# Patient Record
Sex: Female | Born: 1995 | Race: White | Hispanic: No | Marital: Single | Smoking: Never smoker
Health system: Southern US, Community
[De-identification: ages and names within clinical notes are randomized; demographics above are authoritative.]

## PROBLEM LIST (undated history)

## (undated) DIAGNOSIS — J45909 Unspecified asthma, uncomplicated: Secondary | ICD-10-CM

---

## 2019-11-23 ENCOUNTER — Other Ambulatory Visit: Payer: Self-pay

## 2019-11-23 ENCOUNTER — Observation Stay (HOSPITAL_COMMUNITY)
Admission: EM | Admit: 2019-11-23 | Discharge: 2019-11-24 | Disposition: A | Discharge status: Home / Self Care | Payer: BC Managed Care – PPO | Attending: Internal Medicine | Admitting: Internal Medicine

## 2019-11-23 ENCOUNTER — Encounter (HOSPITAL_COMMUNITY): Payer: Self-pay | Admitting: Emergency Medicine

## 2019-11-23 ENCOUNTER — Emergency Department (HOSPITAL_COMMUNITY): Payer: BC Managed Care – PPO

## 2019-11-23 DIAGNOSIS — J9601 Acute respiratory failure with hypoxia: Secondary | ICD-10-CM

## 2019-11-23 DIAGNOSIS — R000 Tachycardia, unspecified: Secondary | ICD-10-CM | POA: Diagnosis not present

## 2019-11-23 DIAGNOSIS — J45901 Unspecified asthma with (acute) exacerbation: Secondary | ICD-10-CM | POA: Diagnosis present

## 2019-11-23 DIAGNOSIS — Z20822 Contact with and (suspected) exposure to covid-19: Secondary | ICD-10-CM | POA: Diagnosis not present

## 2019-11-23 DIAGNOSIS — R0602 Shortness of breath: Secondary | ICD-10-CM | POA: Diagnosis present

## 2019-11-23 DIAGNOSIS — J4521 Mild intermittent asthma with (acute) exacerbation: Secondary | ICD-10-CM | POA: Diagnosis not present

## 2019-11-23 DIAGNOSIS — Z793 Long term (current) use of hormonal contraceptives: Secondary | ICD-10-CM | POA: Insufficient documentation

## 2019-11-23 HISTORY — DX: Unspecified asthma, uncomplicated: J45.909

## 2019-11-23 LAB — CBC
HCT: 45.4 % (ref 36.0–46.0)
Hemoglobin: 14.7 g/dL (ref 12.0–15.0)
MCH: 31.1 pg (ref 26.0–34.0)
MCHC: 32.4 g/dL (ref 30.0–36.0)
MCV: 96.2 fL (ref 80.0–100.0)
Platelets: 215 10*3/uL (ref 150–400)
RBC: 4.72 MIL/uL (ref 3.87–5.11)
RDW: 13.6 % (ref 11.5–15.5)
WBC: 11.7 10*3/uL — ABNORMAL HIGH (ref 4.0–10.5)
nRBC: 0 % (ref 0.0–0.2)

## 2019-11-23 LAB — BASIC METABOLIC PANEL
Anion gap: 11 (ref 5–15)
BUN: 8 mg/dL (ref 6–20)
CO2: 24 mmol/L (ref 22–32)
Calcium: 9.4 mg/dL (ref 8.9–10.3)
Chloride: 105 mmol/L (ref 98–111)
Creatinine, Ser: 0.97 mg/dL (ref 0.44–1.00)
GFR calc Af Amer: >60 mL/min (ref >60)
GFR calc non Af Amer: >60 mL/min (ref >60)
Glucose, Bld: 101 mg/dL — ABNORMAL HIGH (ref 70–99)
Potassium: 4.2 mmol/L (ref 3.5–5.1)
Sodium: 140 mmol/L (ref 135–145)

## 2019-11-23 LAB — I-STAT BETA HCG BLOOD, ED (MC, WL, AP ONLY): I-stat hCG, quantitative: <5 m[IU]/mL (ref <5)

## 2019-11-23 LAB — TROPONIN I (HIGH SENSITIVITY)
Troponin I (High Sensitivity): <2 ng/L (ref <18)
Troponin I (High Sensitivity): 2 ng/L (ref <18)

## 2019-11-23 MED ORDER — SODIUM CHLORIDE 0.9 % IV BOLUS
1000.0000 mL | Freq: Once | INTRAVENOUS | Status: AC
  Administered 2019-11-23: 1000 mL via INTRAVENOUS

## 2019-11-23 MED ORDER — AEROCHAMBER PLUS FLO-VU LARGE MISC
1.0000 | Freq: Once | Status: AC
  Administered 2019-11-23: 1

## 2019-11-23 MED ORDER — IPRATROPIUM BROMIDE HFA 17 MCG/ACT IN AERS
2.0000 | INHALATION_SPRAY | Freq: Once | RESPIRATORY_TRACT | Status: AC
  Administered 2019-11-23: 2 via RESPIRATORY_TRACT
  Filled 2019-11-23: qty 12.9

## 2019-11-23 MED ORDER — SODIUM CHLORIDE 0.9% FLUSH
3.0000 mL | Freq: Once | INTRAVENOUS | Status: AC
  Administered 2019-11-24: 3 mL via INTRAVENOUS

## 2019-11-23 MED ORDER — METHYLPREDNISOLONE SODIUM SUCC 125 MG IJ SOLR
125.0000 mg | Freq: Once | INTRAMUSCULAR | Status: AC
  Administered 2019-11-24: 125 mg via INTRAVENOUS
  Filled 2019-11-23: qty 2

## 2019-11-23 MED ORDER — ALBUTEROL SULFATE HFA 108 (90 BASE) MCG/ACT IN AERS
8.0000 | INHALATION_SPRAY | Freq: Once | RESPIRATORY_TRACT | Status: AC
  Administered 2019-11-23: 8 via RESPIRATORY_TRACT
  Filled 2019-11-23: qty 6.7

## 2019-11-23 NOTE — ED Provider Notes (Signed)
[facility] EMERGENCY DEPARTMENT Provider Note   CSN: [id number] Arrival date & time: 11/23/19  1721     History Chief Complaint  Patient presents with   Shortness of Breath   Cough    Melinda Mclean is a 24 y.o. female with a history of asthma who presents to the emergency department from urgent care with a chief complaint of shortness of breath.  The patient reports that around 4 days ago she developed sinus pain and pressure, rhinorrhea, nasal congestion, headache approximately 5 days ago.  She initially thought she had a sinus infection then 2 days that she began to feel short of breath and developed a productive cough with green phlegm, bilateral upper back pain, chest tightness, and wheezing.  Her symptoms progressively worsened so she went to [facility] earlier today.  She had a rapid COVID-19 test that was negative, but they advised her to come to the ER for further evaluation due to oxygen saturation of 78% on room air and tachycardia.   On arrival to the ER, she was tachycardic in the 150s.  She denies dizziness, lightheadedness, fever, chills, abdominal pain, leg swelling, syncope, nausea, vomiting, or diarrhea.  Reports that she has an albuterol inhaler, but it is at her home in [place].  No recent long travel.  No history of VTE.  Melinda Mclean was evaluated in Emergency Department on 11/24/2019 for the symptoms described in the history of present illness. She was evaluated in the context of the global COVID-19 pandemic, which necessitated consideration that the patient might be at risk for infection with the SARS-CoV-2 virus that causes COVID-19. Institutional protocols and algorithms that pertain to the evaluation of patients at risk for COVID-19 are in a state of rapid change based on information released by regulatory bodies including the CDC and federal and state organizations. These policies and algorithms were followed during  the patient's care in the ED.   The history is provided by the patient. No language interpreter was used.       Past Medical History:  Diagnosis Date   Asthma     Patient Active Problem List   Diagnosis Date Noted   Acute asthma exacerbation 11/24/2019    History reviewed. No pertinent surgical history.   OB History   No obstetric history on file.     History reviewed. No pertinent family history.  Social History   Tobacco Use   Smoking status: Never Smoker   Smokeless tobacco: Never Used  Substance Use Topics   Alcohol use: Yes   Drug use: Yes    Types: Cocaine, Marijuana    Home Medications Prior to Admission medications   Medication Sig Start Date End Date Taking? Authorizing Provider  JUNEL FE 1/20 1-20 MG-MCG tablet Take 1 tablet by mouth at bedtime. 10/25/19  Yes [provider]    Allergies    Patient has no known allergies.  Review of Systems   Review of Systems  Constitutional: Negative for activity change, chills, diaphoresis and fever.  Respiratory: Positive for cough, chest tightness, shortness of breath and wheezing.   Cardiovascular: Negative for chest pain.  Gastrointestinal: Negative for abdominal pain, diarrhea, nausea and vomiting.  Genitourinary: Negative for dysuria.  Musculoskeletal: Positive for back pain. Negative for myalgias, neck pain and neck stiffness.  Skin: Negative for rash.  Allergic/Immunologic: Negative for immunocompromised state.  Neurological: Negative for seizures, syncope, weakness, numbness and headaches.  Psychiatric/Behavioral: Negative for confusion.  Physical Exam Updated Vital Signs BP (!) 110/53    Pulse (!) 124    Temp 99.5 F (37.5 C)    Resp 19    Ht 5\' 3"  (1.6 m)    Wt 61.2 kg    SpO2 94%    BMI 23.91 kg/m   Physical Exam Vitals and nursing note reviewed.  Constitutional:      General: She is not in acute distress.    Comments: Well-appearing.  Nontoxic.  No acute distress.  HENT:      Head: Normocephalic.  Eyes:     Conjunctiva/sclera: Conjunctivae normal.  Neck:     Thyroid: No thyroid mass, thyromegaly or thyroid tenderness.  Cardiovascular:     Rate and Rhythm: Normal rate and regular rhythm.     Heart sounds: No murmur. No friction rub. No gallop.   Pulmonary:     Effort: Pulmonary effort is normal. Tachypnea present. No respiratory distress.     Breath sounds: No stridor. Wheezing present. No rhonchi or rales.     Comments: Inspiratory and expiratory wheezes in all fields.  Poor air movement.  No accessory muscle use or retractions.  No respiratory distress. Chest:     Chest wall: No tenderness.  Abdominal:     General: There is no distension.     Palpations: Abdomen is soft.  Musculoskeletal:     Cervical back: Neck supple.     Right lower leg: No edema.     Left lower leg: No edema.  Skin:    General: Skin is warm.     Findings: No rash.  Neurological:     Mental Status: She is alert.  Psychiatric:        Behavior: Behavior normal.     ED Results / Procedures / Treatments   Labs (all labs ordered are listed, but only abnormal results are displayed) Labs Reviewed  BASIC METABOLIC PANEL - Abnormal; Notable for the following components:      Result Value   Glucose, Bld 101 (*)    All other components within normal limits  CBC - Abnormal; Notable for the following components:   WBC 11.7 (*)    All other components within normal limits  RESPIRATORY PANEL BY RT PCR (FLU A&B, COVID)  D-DIMER, QUANTITATIVE (NOT AT [facility])  TSH  T4, FREE  T3, FREE  PROCALCITONIN  BASIC METABOLIC PANEL  CBC WITH DIFFERENTIAL/PLATELET  RAPID URINE DRUG SCREEN, HOSP PERFORMED  I-STAT BETA HCG BLOOD, ED ([facility], [facility], [facility] ONLY)  TROPONIN I (HIGH SENSITIVITY)  TROPONIN I (HIGH SENSITIVITY)    EKG EKG Interpretation  Date/Time:  Tuesday November 23 2019 18:02:51 EDT Ventricular Rate:  138 PR Interval:    QRS Duration: 74 QT Interval:  354 QTC Calculation: 536 R  Axis:   74 Text Interpretation: Sinus tachycardia Possible Anterior infarct , age undetermined ST & T wave abnormality, consider inferior ischemia Abnormal ECG No old tracing to compare Confirmed by 10-23-1979 (Eber Hong) on 11/23/2019 10:54:52 PM   Radiology DG Chest 2 View  Result Date: 11/23/2019 CLINICAL DATA:  Shortness of breath EXAM: CHEST - 2 VIEW COMPARISON:  None. FINDINGS: Heart and mediastinal contours are within normal limits. No focal opacities or effusions. No acute bony abnormality. IMPRESSION: Negative. Electronically Signed   By: 11/25/2019 M.D.   On: 11/23/2019 19:00    Procedures .Critical Care Performed by: 11/25/2019, PA-C Authorized by: Barkley Boards, PA-C   Critical care provider statement:  Critical care time (minutes):  55   Critical care time was exclusive of:  Separately billable procedures and treating other patients and teaching time   Critical care was necessary to treat or prevent imminent or life-threatening deterioration of the following conditions:  Respiratory failure   Critical care was time spent personally by me on the following activities:  Ordering and performing treatments and interventions, ordering and review of laboratory studies, ordering and review of radiographic studies, pulse oximetry, re-evaluation of patient's condition, obtaining history from patient or surrogate, examination of patient, evaluation of patient's response to treatment, development of treatment plan with patient or surrogate and review of old charts   (including critical care time)  Medications Ordered in ED Medications  magnesium sulfate IVPB 2 g 50 mL (has no administration in time range)  albuterol (PROVENTIL) (2.5 MG/3ML) 0.083% nebulizer solution 2.5 mg (has no administration in time range)  methylPREDNISolone sodium succinate (SOLU-MEDROL) 40 mg/mL injection 40 mg (has no administration in time range)  guaiFENesin (ROBITUSSIN) 100 MG/5ML solution 100 mg (has  no administration in time range)  sodium chloride flush (NS) 0.9 % injection 3 mL (3 mLs Intravenous Given 11/24/19 0042)  AeroChamber Plus Flo-Vu Large MISC 1 each (1 each Other Given 11/23/19 2338)  albuterol (VENTOLIN HFA) 108 (90 Base) MCG/ACT inhaler 8 puff (8 puffs Inhalation Given 11/23/19 2338)  ipratropium (ATROVENT HFA) inhaler 2 puff (2 puffs Inhalation Given 11/23/19 2338)  sodium chloride 0.9 % bolus 1,000 mL (0 mLs Intravenous Stopped 11/24/19 0056)  methylPREDNISolone sodium succinate (SOLU-MEDROL) 125 mg/2 mL injection 125 mg (125 mg Intravenous Given 11/24/19 0023)  albuterol (PROVENTIL) (2.5 MG/3ML) 0.083% nebulizer solution 5 mg (5 mg Nebulization Given 11/24/19 0215)    ED Course  I have reviewed the triage vital signs and the nursing notes.  Pertinent labs & imaging results that were available during my care of the patient were reviewed by me and considered in my medical decision making (see chart for details).  Clinical Course as of Nov 23 501  Wed Nov 24, 2019  0118 Patient recheck. She continues to have inspiratory and expiratory wheezes in all fields, but air movement is slightly improved. Heart rate continues to remain in the 130s to 140s on the monitor. She reports some improvement in her work of breathing.   [MM]    Clinical Course User Index [MM] Deziyah Arvin, Coral Else, PA-C   MDM Rules/Calculators/A&P                      24 year old female with a history of asthma presenting with nasal congestion, sinus pain and pressure, and rhinorrhea 4 days ago that has progressed to shortness of breath, wheezing, productive cough, chest tightness, and upper back pain over the last 2 days.  She presented from urgent care where she was found to have an oxygen saturation of 78% on room air.  She was tachycardic in the 150s on arrival to the ER.  She was initially well-appearing, but has gradually declined since arrival in the ER.  Initially, oxygen saturations were in the 90, but she  had a documented saturation of 77% and after receiving an albuterol and ipratropium inhaler, albuterol nebulizer, and Solu-Medrol she was satting at 86% on room air at rest.  Magnesium sulfate has been ordered and is currently being administered.  The patient was seen and independently evaluated by Dr. Bebe Shaggy, attending physician.  Initially, patient had significant tachycardia, but had no use of corticosteroids prior to  arrival in the ER.  Considered hypothyroidism, but TSH was normal.  Also considered PE, but she was low risk and D-dimer was negative.  Chest x-ray was unremarkable for pneumonia.  Labs are otherwise reassuring.  EKG with sinus tachycardia.  Troponin is not elevated. Doubt ACS, pneumothorax, or aortic dissection.  COVID-19 test is negative.  Given patient's continued acute respiratory failure despite aggressive treatment in the ER, the patient warrants admission.  Dr. Myna Hidalgo with the hospitalist team will admit. The patient appears reasonably stabilized for admission considering the current resources, flow, and capabilities available in the ED at this time, and I doubt any other [facility] requiring further screening and/or treatment in the ED prior to admission.   Final Clinical Impression(s) / ED Diagnoses Final diagnoses:  Intermittent asthma with acute exacerbation, unspecified asthma severity  Acute respiratory failure with hypoxia [facility])    Rx / DC Orders ED Discharge Orders    None       Joanne Gavel, PA-C 11/24/19 0503    Ripley Fraise, MD 11/24/19 0710

## 2019-11-23 NOTE — ED Triage Notes (Addendum)
Pt arrives to ED from her boyfriends house with complaints of runny nose, fatigue, cough, and congestion since Friday. Patient states that starting on Sunday she has been short of breath and starting to cough up green phlem that has been worsening. Patient was tested negative for COVID today and sent to ER for low O2 saturations and tachycardia.

## 2019-11-24 ENCOUNTER — Encounter (HOSPITAL_COMMUNITY): Payer: Self-pay | Admitting: Family Medicine

## 2019-11-24 DIAGNOSIS — J9601 Acute respiratory failure with hypoxia: Secondary | ICD-10-CM | POA: Insufficient documentation

## 2019-11-24 DIAGNOSIS — J45901 Unspecified asthma with (acute) exacerbation: Secondary | ICD-10-CM | POA: Diagnosis not present

## 2019-11-24 LAB — T4, FREE: Free T4: 0.87 ng/dL (ref 0.61–1.12)

## 2019-11-24 LAB — RESPIRATORY PANEL BY RT PCR (FLU A&B, COVID)
Influenza A by PCR: NEGATIVE
Influenza B by PCR: NEGATIVE
SARS Coronavirus 2 by RT PCR: NEGATIVE

## 2019-11-24 LAB — D-DIMER, QUANTITATIVE: D-Dimer, Quant: 0.31 ug{FEU}/mL (ref 0.00–0.50)

## 2019-11-24 LAB — TSH: TSH: 1.189 u[IU]/mL (ref 0.350–4.500)

## 2019-11-24 MED ORDER — ONDANSETRON HCL 4 MG/2ML IJ SOLN: 4.0000 mg | Freq: Four times a day (QID) | INTRAMUSCULAR | Status: DC | PRN

## 2019-11-24 MED ORDER — GUAIFENESIN 100 MG/5ML PO SOLN
5.0000 mL | ORAL | Status: DC | PRN
  Filled 2019-11-24: qty 5

## 2019-11-24 MED ORDER — SODIUM CHLORIDE 0.9% FLUSH: 3.0000 mL | INTRAVENOUS | Status: DC | PRN

## 2019-11-24 MED ORDER — ONDANSETRON HCL 4 MG PO TABS: 4.0000 mg | ORAL_TABLET | Freq: Four times a day (QID) | ORAL | Status: DC | PRN

## 2019-11-24 MED ORDER — IPRATROPIUM-ALBUTEROL 0.5-2.5 (3) MG/3ML IN SOLN: 3.0000 mL | Freq: Four times a day (QID) | RESPIRATORY_TRACT | 1 refills | Status: AC | PRN

## 2019-11-24 MED ORDER — ACETAMINOPHEN 650 MG RE SUPP: 650.0000 mg | Freq: Four times a day (QID) | RECTAL | Status: DC | PRN

## 2019-11-24 MED ORDER — ENOXAPARIN SODIUM 40 MG/0.4ML ~~LOC~~ SOLN
40.0000 mg | SUBCUTANEOUS | Status: DC
  Administered 2019-11-24: 40 mg via SUBCUTANEOUS
  Filled 2019-11-24: qty 0.4

## 2019-11-24 MED ORDER — ACETAMINOPHEN 325 MG PO TABS: 650.0000 mg | ORAL_TABLET | Freq: Four times a day (QID) | ORAL | Status: DC | PRN

## 2019-11-24 MED ORDER — SODIUM CHLORIDE 0.9 % IV SOLN: INTRAVENOUS | Status: AC

## 2019-11-24 MED ORDER — SODIUM CHLORIDE 0.9% FLUSH: 3.0000 mL | Freq: Two times a day (BID) | INTRAVENOUS | Status: DC

## 2019-11-24 MED ORDER — IPRATROPIUM-ALBUTEROL 0.5-2.5 (3) MG/3ML IN SOLN
3.0000 mL | Freq: Once | RESPIRATORY_TRACT | Status: AC
  Administered 2019-11-24: 3 mL via RESPIRATORY_TRACT
  Filled 2019-11-24: qty 3

## 2019-11-24 MED ORDER — SODIUM CHLORIDE 0.9 % IV SOLN: 250.0000 mL | INTRAVENOUS | Status: DC | PRN

## 2019-11-24 MED ORDER — ALBUTEROL SULFATE HFA 108 (90 BASE) MCG/ACT IN AERS: 2.0000 | INHALATION_SPRAY | Freq: Four times a day (QID) | RESPIRATORY_TRACT | 0 refills | Status: AC | PRN

## 2019-11-24 MED ORDER — PREDNISONE 20 MG PO TABS: 40.0000 mg | ORAL_TABLET | Freq: Every day | ORAL | 0 refills | Status: AC

## 2019-11-24 MED ORDER — ALBUTEROL SULFATE (2.5 MG/3ML) 0.083% IN NEBU: 2.5000 mg | INHALATION_SOLUTION | RESPIRATORY_TRACT | Status: DC | PRN

## 2019-11-24 MED ORDER — ALBUTEROL SULFATE (2.5 MG/3ML) 0.083% IN NEBU
5.0000 mg | INHALATION_SOLUTION | Freq: Once | RESPIRATORY_TRACT | Status: AC
  Administered 2019-11-24: 5 mg via RESPIRATORY_TRACT
  Filled 2019-11-24: qty 6

## 2019-11-24 MED ORDER — MAGNESIUM SULFATE 2 GM/50ML IV SOLN
2.0000 g | Freq: Once | INTRAVENOUS | Status: AC
  Administered 2019-11-24: 2 g via INTRAVENOUS
  Filled 2019-11-24: qty 50

## 2019-11-24 MED ORDER — METHYLPREDNISOLONE SODIUM SUCC 40 MG IJ SOLR
40.0000 mg | Freq: Two times a day (BID) | INTRAMUSCULAR | Status: DC
  Administered 2019-11-24: 05:00:00 40 mg via INTRAVENOUS
  Filled 2019-11-24: qty 1

## 2019-11-24 MED ORDER — HYDROCODONE-ACETAMINOPHEN 5-325 MG PO TABS: 1.0000 | ORAL_TABLET | Freq: Four times a day (QID) | ORAL | Status: DC | PRN

## 2019-11-24 MED ORDER — GUAIFENESIN 100 MG/5ML PO SOLN: 5.0000 mL | ORAL | 0 refills | Status: AC | PRN

## 2019-11-24 MED ORDER — SENNOSIDES-DOCUSATE SODIUM 8.6-50 MG PO TABS: 1.0000 | ORAL_TABLET | Freq: Every evening | ORAL | Status: DC | PRN

## 2019-11-24 NOTE — ED Notes (Signed)
Pt's pulse ox sat 95% RA while ambulating. No complaints of sob.

## 2019-11-24 NOTE — ED Notes (Signed)
Pt ambulated to restroom and back to bed on room air, pt tolerated well. Pt states she feels better. Pt placed back on monitor O2 sat 97% on room air.

## 2019-11-24 NOTE — ED Notes (Signed)
Pt was lying flat in bed for approx 10 minutes and )2 sats dropped to 86%. Pt was placed on oxygen 2LPM via [place]

## 2019-11-24 NOTE — ED Notes (Signed)
Admitting Md at bedside

## 2019-11-24 NOTE — H&P (Signed)
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  Patient reports that she developed rhinorrhea, fatigue, cough, and congestion on 11/19/2019, and shortness of breath and sputum production 2 days later, worsening in her dyspnea and wheezing.  She was seen in an outpatient clinic on 11/23/2019 where she reports testing negative for COVID-19, but was found to be saturating in the upper 70s and tachycardic and so she was to the ED for further evaluation.  She has never required intubation for her asthma, last exacerbation was in 2019 and treated at her school medical clinic with nebs, she has albuterol inhaler but has not used it since.  She denies any other significant medical history.  She denies any chest pain, palpitations, leg swelling tenderness, fevers, or chills associated with the current illness.  ED Course: Upon arrival to the ED, patient is found to be afebrile tachycardic to 150s, tachypneic to the low 30s, saturating mid 80s on room air, with stable blood pressure.  EKG features sinus tachycardia with rate 138 and nonspecific ST and ST abnormality.  Chest x-ray is negative. Chemistry panel unremarkable.  CBC with leukocytosis to 11,700.  High-sensitivity troponin normal x2 and D-dimer normal.  Covid and influenza PCR are negative. TSH and free T4 normal. Urine pregnancy test negative. Patient was given IV fluids, IV steroids, albuterol and Atrovent nebs, IV magnesium, and started on supplemental oxygen in the ED.  Review of Systems:  All other systems reviewed and apart from HPI, are negative.  Past Medical History:  Diagnosis Date  . Asthma     History  reviewed. No pertinent surgical history.   reports that she has never smoked. She has never used smokeless tobacco. She reports current alcohol use. She reports current drug use. Drugs: Cocaine and Marijuana.  No Known Allergies  History reviewed. No pertinent family history.   Prior to Admission medications   Medication Sig Start Date End Date Taking? Authorizing Provider  JUNEL FE 1/20 1-20 MG-MCG tablet Take 1 tablet by mouth at bedtime. 10/25/19  Yes [provider]    Physical Exam: Vitals:   11/24/19 0100 11/24/19 0149 11/24/19 0230 11/24/19 0315  BP: (!) 111/59 128/74 (!) 102/59 (!) 110/53  Pulse: (!) 132 (!) 122 (!) 141 (!) 124  Resp: (!) 30 (!) 31 18 19   Temp: 99.5 F (37.5 C)     TempSrc:      SpO2: 94% 95% 100% 94%  Weight:      Height:        Constitutional: calm, no diaphoresis  Eyes: PERTLA, lids and conjunctivae normal ENMT: Mucous membranes are moist. Posterior pharynx clear of any exudate or lesions.   Neck: normal, supple, no masses, no thyromegaly Respiratory: Diffuse wheezes, dyspnea with speech. No pallor or cyanosis.   Cardiovascular: Rate ~120 and regular. No extremity edema.   Abdomen: No distension, no tenderness, soft. Bowel sounds active.  Musculoskeletal: no clubbing / cyanosis. No joint deformity upper and lower extremities.   Skin: no significant rashes, lesions, ulcers. Warm, dry, well-perfused. Neurologic: CN 2-12 grossly intact. Sensation intact. Moving all extremities.  Psychiatric: Alert and oriented x 3. Pleasant and cooperative.    Labs and Imaging  on Admission: I have personally reviewed following labs and imaging studies  CBC: Recent Labs  Lab 11/23/19 1819  WBC 11.7*  HGB 14.7  HCT 45.4  MCV 96.2  PLT 458   Basic Metabolic Panel: Recent Labs  Lab 11/23/19 1819  NA 140  K 4.2  CL 105  CO2 24  GLUCOSE 101*  BUN 8  CREATININE 0.97  CALCIUM 9.4   GFR: Estimated Creatinine Clearance: 74 mL/min (by C-G  formula based on SCr of 0.97 mg/dL). Liver Function Tests: No results for input(s): AST, ALT, ALKPHOS, BILITOT, PROT, ALBUMIN in the last 168 hours. No results for input(s): LIPASE, AMYLASE in the last 168 hours. No results for input(s): AMMONIA in the last 168 hours. Coagulation Profile: No results for input(s): INR, PROTIME in the last 168 hours. Cardiac Enzymes: No results for input(s): CKTOTAL, CKMB, CKMBINDEX, TROPONINI in the last 168 hours. BNP (last 3 results) No results for input(s): PROBNP in the last 8760 hours. HbA1C: No results for input(s): HGBA1C in the last 72 hours. CBG: No results for input(s): GLUCAP in the last 168 hours. Lipid Profile: No results for input(s): CHOL, HDL, LDLCALC, TRIG, CHOLHDL, LDLDIRECT in the last 72 hours. Thyroid Function Tests: Recent Labs    11/23/19 2322  TSH 1.189  FREET4 0.87   Anemia Panel: No results for input(s): VITAMINB12, FOLATE, FERRITIN, TIBC, IRON, RETICCTPCT in the last 72 hours. Urine analysis: No results found for: COLORURINE, APPEARANCEUR, LABSPEC, PHURINE, GLUCOSEU, HGBUR, BILIRUBINUR, KETONESUR, PROTEINUR, UROBILINOGEN, NITRITE, LEUKOCYTESUR Sepsis Labs: @LABRCNTIP (procalcitonin:4,lacticidven:4) ) Recent Results (from the past 240 hour(s))  Respiratory Panel by RT PCR (Flu A&B, Covid) - Nasopharyngeal Swab     Status: None   Collection Time: 11/24/19 12:21 AM   Specimen: Nasopharyngeal Swab  Result Value Ref Range Status   SARS Coronavirus 2 by RT PCR NEGATIVE NEGATIVE Final    Comment: (NOTE) SARS-CoV-2 target nucleic acids are NOT DETECTED. The SARS-CoV-2 RNA is generally detectable in upper respiratoy specimens during the acute phase of infection. The lowest concentration of SARS-CoV-2 viral copies this assay can detect is 131 copies/mL. A negative result does not preclude SARS-Cov-2 infection and should not be used as the sole basis for treatment or other patient management decisions. A negative result may  occur with  improper specimen collection/handling, submission of specimen other than nasopharyngeal swab, presence of viral mutation(s) within the areas targeted by this assay, and inadequate number of viral copies (<131 copies/mL). A negative result must be combined with clinical observations, patient history, and epidemiological information. The expected result is Negative. Fact Sheet for Patients:  PinkCheek.be Fact Sheet for Healthcare Providers:  GravelBags.it This test is not yet ap proved or cleared by the [place] FDA and  has been authorized for detection and/or diagnosis of SARS-CoV-2 by FDA under an Emergency Use Authorization (EUA). This EUA will remain  in effect (meaning this test can be used) for the duration of the COVID-19 declaration under Section 564(b)(1) of the Act, 21 U.S.C. section 360bbb-3(b)(1), unless the authorization is terminated or revoked sooner.    Influenza A by PCR NEGATIVE NEGATIVE Final   Influenza B by PCR NEGATIVE NEGATIVE Final    Comment: (NOTE) The Xpert Xpress SARS-CoV-2/FLU/RSV assay is intended as an aid in  the diagnosis of influenza from Nasopharyngeal swab specimens and  should not be used as a sole basis for treatment. Nasal washings and  aspirates are unacceptable for Xpert Xpress SARS-CoV-2/FLU/RSV  testing. Fact Sheet for Patients: PinkCheek.be Fact Sheet  for Healthcare Providers: https://www.young.biz/ This test is not yet approved or cleared by the [place] and  has been authorized for detection and/or diagnosis of SARS-CoV-2 by  FDA under an Emergency Use Authorization (EUA). This EUA will remain  in effect (meaning this test can be used) for the duration of the  Covid-19 declaration under Section 564(b)(1) of the Act, 21  U.S.C. section 360bbb-3(b)(1), unless the authorization is  terminated or  revoked. Performed at [facility], [address], [place], [place] [address]      Radiological Exams on Admission: DG Chest 2 View  Result Date: 11/23/2019 CLINICAL DATA:  Shortness of breath EXAM: CHEST - 2 VIEW COMPARISON:  None. FINDINGS: Heart and mediastinal contours are within normal limits. No focal opacities or effusions. No acute bony abnormality. IMPRESSION: Negative. Electronically Signed   By: Charlett Nose M.D.   On: 11/23/2019 19:00    EKG: Independently reviewed. Sinus tachycardia, rate 138, non-specific ST-T abnormality.   Assessment/Plan  1. Acute asthma exacerbation  - Presents with SOB and wheezing that was preceded by 4 days of URI sxs, found to by hypoxic, tachypneic, and tachycardic with clear CXR, and low d-dimer - Seems to have been precipitated by infection, though no fever and COVID and influenza pcr negative in ED  - She was started on supplemental O2 in ED and treated with IV steroids, IV mag, Atrovent and albuterol  - Continue systemic steroid, continue albuterol, continue supplemental O2 as needed     DVT prophylaxis: Lovenox  Code Status: Full  Family Communication: Discussed with patient   Disposition Plan: Likely home in 2-3 once stable on oral medications and inhalers, no longer requiring supplemental O2, and able to ambulate without significant SOB Consults called: None  Admission status: Inpatient     Briscoe Deutscher, MD [facility] Pager: See [email or web address]  If 7AM-7PM, please contact the daytime attending [email or web address]  11/24/2019, 5:02 AM

## 2019-11-24 NOTE — ED Provider Notes (Signed)
Patient seen/examined in the Emergency Department in conjunction with Advanced Practice Provider McDonald Patient reports wheezing and shortness of breath Exam : awake/alert, tachycardic and wheezing bilaterally Plan: d-dimer negative, less likely PE Will treat for asthma and will likely need admit unless turns around in the ED     Zadie Rhine, MD 11/24/19 0021

## 2019-11-24 NOTE — ED Notes (Signed)
Pt discharged at this time. Discharge instructions reviewed, opportunity to ask questions provided, pt verbalizes understanding of discharge instructions.   

## 2019-11-25 NOTE — Discharge Summary (Signed)
Physician Discharge Summary  Melinda Mclean EHM:[id number] DOB: August 10, 1996 DOA: 11/23/2019  PCP: Patient, No Pcp Per  Admit date: 11/23/2019 Discharge date: 11/24/2019  Admitted From: Home Disposition:  Home  Recommendations for Outpatient Follow-up:  1. Follow up with PCP in 1-2 weeks 2. Please obtain BMP/CBC in one week   Discharge Condition:stable.  CODE STATUS: Full code.  Diet recommendation: Heart Healthy   Brief/Interim Summary:  Melinda Mclean is a 24 y.o. 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  Patient reports that she developed rhinorrhea, fatigue, cough, and congestion on 11/19/2019, and shortness of breath and sputum production 2 days later, worsening in her dyspnea and wheezing.  She was seen in an outpatient clinic on 11/23/2019 where she reports testing negative for COVID-19.   Discharge Diagnoses:  Principal Problem:   Acute asthma exacerbation  Acute asthma exacerbation  - Presents with SOB and wheezing that was preceded by 4 days of URI sxs, found to by hypoxic, tachypneic, and tachycardic with clear CXR, and low d-dimer. COVID and influenza pcr negative in ED . She was given IV steroids, albuterol and duo neb treatment , her symptoms improved significantly.  On exam this evening pt has minimal wheezing, she was able to ambulate in the room without any cough or hypoxia and her sats remained greater than 92%.  Pt adamant about going home and her family is at bedside. She was given a prescription for prednisone and discharged on duonebs.   Discharge Instructions  Discharge Instructions    Diet - low sodium heart healthy   Complete by: As directed    Discharge instructions   Complete by: As directed    Follow up with PCP in one week.   For home use only DME Nebulizer machine   Complete by: As directed    Patient needs a nebulizer to treat with the  following condition: Asthma   Length of Need: Lifetime     Allergies as of 11/24/2019   No Known Allergies     Medication List    TAKE these medications   albuterol 108 (90 Base) MCG/ACT inhaler Commonly known as: VENTOLIN HFA Inhale 2 puffs into the lungs every 6 (six) hours as needed for wheezing or shortness of breath.   guaiFENesin 100 MG/5ML Soln Commonly known as: ROBITUSSIN Take 5 mLs (100 mg total) by mouth every 4 (four) hours as needed for cough or to loosen phlegm.   ipratropium-albuterol 0.5-2.5 (3) MG/3ML Soln Commonly known as: DUONEB Take 3 mLs by nebulization every 6 (six) hours as needed.   Junel FE 1/20 1-20 MG-MCG tablet Generic drug: norethindrone-ethinyl estradiol Take 1 tablet by mouth at bedtime.   predniSONE 20 MG tablet Commonly known as: Deltasone Take 2 tablets (40 mg total) by mouth daily for 7 days.            Durable Medical Equipment  (From admission, onward)         Start     Ordered   11/24/19 0000  For home use only DME Nebulizer machine    Question Answer Comment  Patient needs a nebulizer to treat with the following condition Asthma   Length of Need Lifetime      11/24/19 1316         Follow-up Information    primary care physician. Schedule an appointment as soon as possible for a visit in 1 week(s).  No Known Allergies  Consultations:  TOC CONSULT   Procedures/Studies: DG Chest 2 View  Result Date: 11/23/2019 CLINICAL DATA:  Shortness of breath EXAM: CHEST - 2 VIEW COMPARISON:  None. FINDINGS: Heart and mediastinal contours are within normal limits. No focal opacities or effusions. No acute bony abnormality. IMPRESSION: Negative. Electronically Signed   By: Rolm Baptise M.D.   On: 11/23/2019 19:00       Subjective:  No chest pain or sob. Wants to go home. Discharge Exam: Vitals:   11/24/19 1100 11/24/19 1502  BP: 125/70 128/80  Pulse: 99 96  Resp: 19 18  Temp:  98.8 F (37.1 C)  SpO2: 98%  96%   Vitals:   11/24/19 0900 11/24/19 1000 11/24/19 1100 11/24/19 1502  BP: 123/66 108/66 125/70 128/80  Pulse: 91 (!) 106 99 96  Resp: 17 (!) 31 19 18   Temp:    98.8 F (37.1 C)  TempSrc:    Oral  SpO2: 96% 93% 98% 96%  Weight:      Height:        General: Pt is alert, awake, not in acute distress Cardiovascular: RRR, S1/S2 +, no rubs, no gallops Respiratory: CTA bilaterally, no wheezing, no rhonchi Abdominal: Soft, NT, ND, bowel sounds + Extremities: no edema, no cyanosis    The results of significant diagnostics from this hospitalization (including imaging, microbiology, ancillary and laboratory) are listed below for reference.     Microbiology: Recent Results (from the past 240 hour(s))  Respiratory Panel by RT PCR (Flu A&B, Covid) - Nasopharyngeal Swab     Status: None   Collection Time: 11/24/19 12:21 AM   Specimen: Nasopharyngeal Swab  Result Value Ref Range Status   SARS Coronavirus 2 by RT PCR NEGATIVE NEGATIVE Final    Comment: (NOTE) SARS-CoV-2 target nucleic acids are NOT DETECTED. The SARS-CoV-2 RNA is generally detectable in upper respiratoy specimens during the acute phase of infection. The lowest concentration of SARS-CoV-2 viral copies this assay can detect is 131 copies/mL. A negative result does not preclude SARS-Cov-2 infection and should not be used as the sole basis for treatment or other patient management decisions. A negative result may occur with  improper specimen collection/handling, submission of specimen other than nasopharyngeal swab, presence of viral mutation(s) within the areas targeted by this assay, and inadequate number of viral copies (<131 copies/mL). A negative result must be combined with clinical observations, patient history, and epidemiological information. The expected result is Negative. Fact Sheet for Patients:  PinkCheek.be Fact Sheet for Healthcare Providers:   GravelBags.it This test is not yet ap proved or cleared by the [place] FDA and  has been authorized for detection and/or diagnosis of SARS-CoV-2 by FDA under an Emergency Use Authorization (EUA). This EUA will remain  in effect (meaning this test can be used) for the duration of the COVID-19 declaration under Section 564(b)(1) of the Act, 21 U.S.C. section 360bbb-3(b)(1), unless the authorization is terminated or revoked sooner.    Influenza A by PCR NEGATIVE NEGATIVE Final   Influenza B by PCR NEGATIVE NEGATIVE Final    Comment: (NOTE) The Xpert Xpress SARS-CoV-2/FLU/RSV assay is intended as an aid in  the diagnosis of influenza from Nasopharyngeal swab specimens and  should not be used as a sole basis for treatment. Nasal washings and  aspirates are unacceptable for Xpert Xpress SARS-CoV-2/FLU/RSV  testing. Fact Sheet for Patients: PinkCheek.be Fact Sheet for Healthcare Providers: GravelBags.it This test is not yet approved or cleared by the [place]  States FDA and  has been authorized for detection and/or diagnosis of SARS-CoV-2 by  FDA under an Emergency Use Authorization (EUA). This EUA will remain  in effect (meaning this test can be used) for the duration of the  Covid-19 declaration under Section 564(b)(1) of the Act, 21  U.S.C. section 360bbb-3(b)(1), unless the authorization is  terminated or revoked. Performed at [facility], [address] [place], [place] [address]      Labs: BNP (last 3 results) No results for input(s): BNP in the last 8760 hours. Basic Metabolic Panel: Recent Labs  Lab 11/23/19 1819  NA 140  K 4.2  CL 105  CO2 24  GLUCOSE 101*  BUN 8  CREATININE 0.97  CALCIUM 9.4   Liver Function Tests: No results for input(s): AST, ALT, ALKPHOS, BILITOT, PROT, ALBUMIN in the last 168 hours. No results for input(s): LIPASE, AMYLASE in the last 168  hours. No results for input(s): AMMONIA in the last 168 hours. CBC: Recent Labs  Lab 11/23/19 1819  WBC 11.7*  HGB 14.7  HCT 45.4  MCV 96.2  PLT 215   Cardiac Enzymes: No results for input(s): CKTOTAL, CKMB, CKMBINDEX, TROPONINI in the last 168 hours. BNP: Invalid input(s): POCBNP CBG: No results for input(s): GLUCAP in the last 168 hours. D-Dimer Recent Labs    11/23/19 2322  DDIMER 0.31   Hgb A1c No results for input(s): HGBA1C in the last 72 hours. Lipid Profile No results for input(s): CHOL, HDL, LDLCALC, TRIG, CHOLHDL, LDLDIRECT in the last 72 hours. Thyroid function studies Recent Labs    11/23/19 2322  TSH 1.189   Anemia work up No results for input(s): VITAMINB12, FOLATE, FERRITIN, TIBC, IRON, RETICCTPCT in the last 72 hours. Urinalysis No results found for: COLORURINE, APPEARANCEUR, LABSPEC, PHURINE, GLUCOSEU, HGBUR, BILIRUBINUR, KETONESUR, PROTEINUR, UROBILINOGEN, NITRITE, LEUKOCYTESUR Sepsis Labs Invalid input(s): PROCALCITONIN,  WBC,  LACTICIDVEN Microbiology Recent Results (from the past 240 hour(s))  Respiratory Panel by RT PCR (Flu A&B, Covid) - Nasopharyngeal Swab     Status: None   Collection Time: 11/24/19 12:21 AM   Specimen: Nasopharyngeal Swab  Result Value Ref Range Status   SARS Coronavirus 2 by RT PCR NEGATIVE NEGATIVE Final    Comment: (NOTE) SARS-CoV-2 target nucleic acids are NOT DETECTED. The SARS-CoV-2 RNA is generally detectable in upper respiratoy specimens during the acute phase of infection. The lowest concentration of SARS-CoV-2 viral copies this assay can detect is 131 copies/mL. A negative result does not preclude SARS-Cov-2 infection and should not be used as the sole basis for treatment or other patient management decisions. A negative result may occur with  improper specimen collection/handling, submission of specimen other than nasopharyngeal swab, presence of viral mutation(s) within the areas targeted by this assay,  and inadequate number of viral copies (<131 copies/mL). A negative result must be combined with clinical observations, patient history, and epidemiological information. The expected result is Negative. Fact Sheet for Patients:  https://www.moore.com/ Fact Sheet for Healthcare Providers:  https://www.young.biz/ This test is not yet ap proved or cleared by the [place] FDA and  has been authorized for detection and/or diagnosis of SARS-CoV-2 by FDA under an Emergency Use Authorization (EUA). This EUA will remain  in effect (meaning this test can be used) for the duration of the COVID-19 declaration under Section 564(b)(1) of the Act, 21 U.S.C. section 360bbb-3(b)(1), unless the authorization is terminated or revoked sooner.    Influenza A by PCR NEGATIVE NEGATIVE Final   Influenza B  by PCR NEGATIVE NEGATIVE Final    Comment: (NOTE) The Xpert Xpress SARS-CoV-2/FLU/RSV assay is intended as an aid in  the diagnosis of influenza from Nasopharyngeal swab specimens and  should not be used as a sole basis for treatment. Nasal washings and  aspirates are unacceptable for Xpert Xpress SARS-CoV-2/FLU/RSV  testing. Fact Sheet for Patients: https://www.moore.com/ Fact Sheet for Healthcare Providers: https://www.young.biz/ This test is not yet approved or cleared by the [place] FDA and  has been authorized for detection and/or diagnosis of SARS-CoV-2 by  FDA under an Emergency Use Authorization (EUA). This EUA will remain  in effect (meaning this test can be used) for the duration of the  Covid-19 declaration under Section 564(b)(1) of the Act, 21  U.S.C. section 360bbb-3(b)(1), unless the authorization is  terminated or revoked. Performed at [facility], [address], [place], [place] [address]      Time coordinating discharge: 34 minutes  SIGNED:   Kathlen Mody, MD  [facility]

## 2020-12-17 IMAGING — DX DG CHEST 2V
2 series · 2 of 2 positions shown · non-contrast
Comparison: None.

CLINICAL DATA: Shortness of breath

EXAM:
CHEST - 2 VIEW

[chest pa]
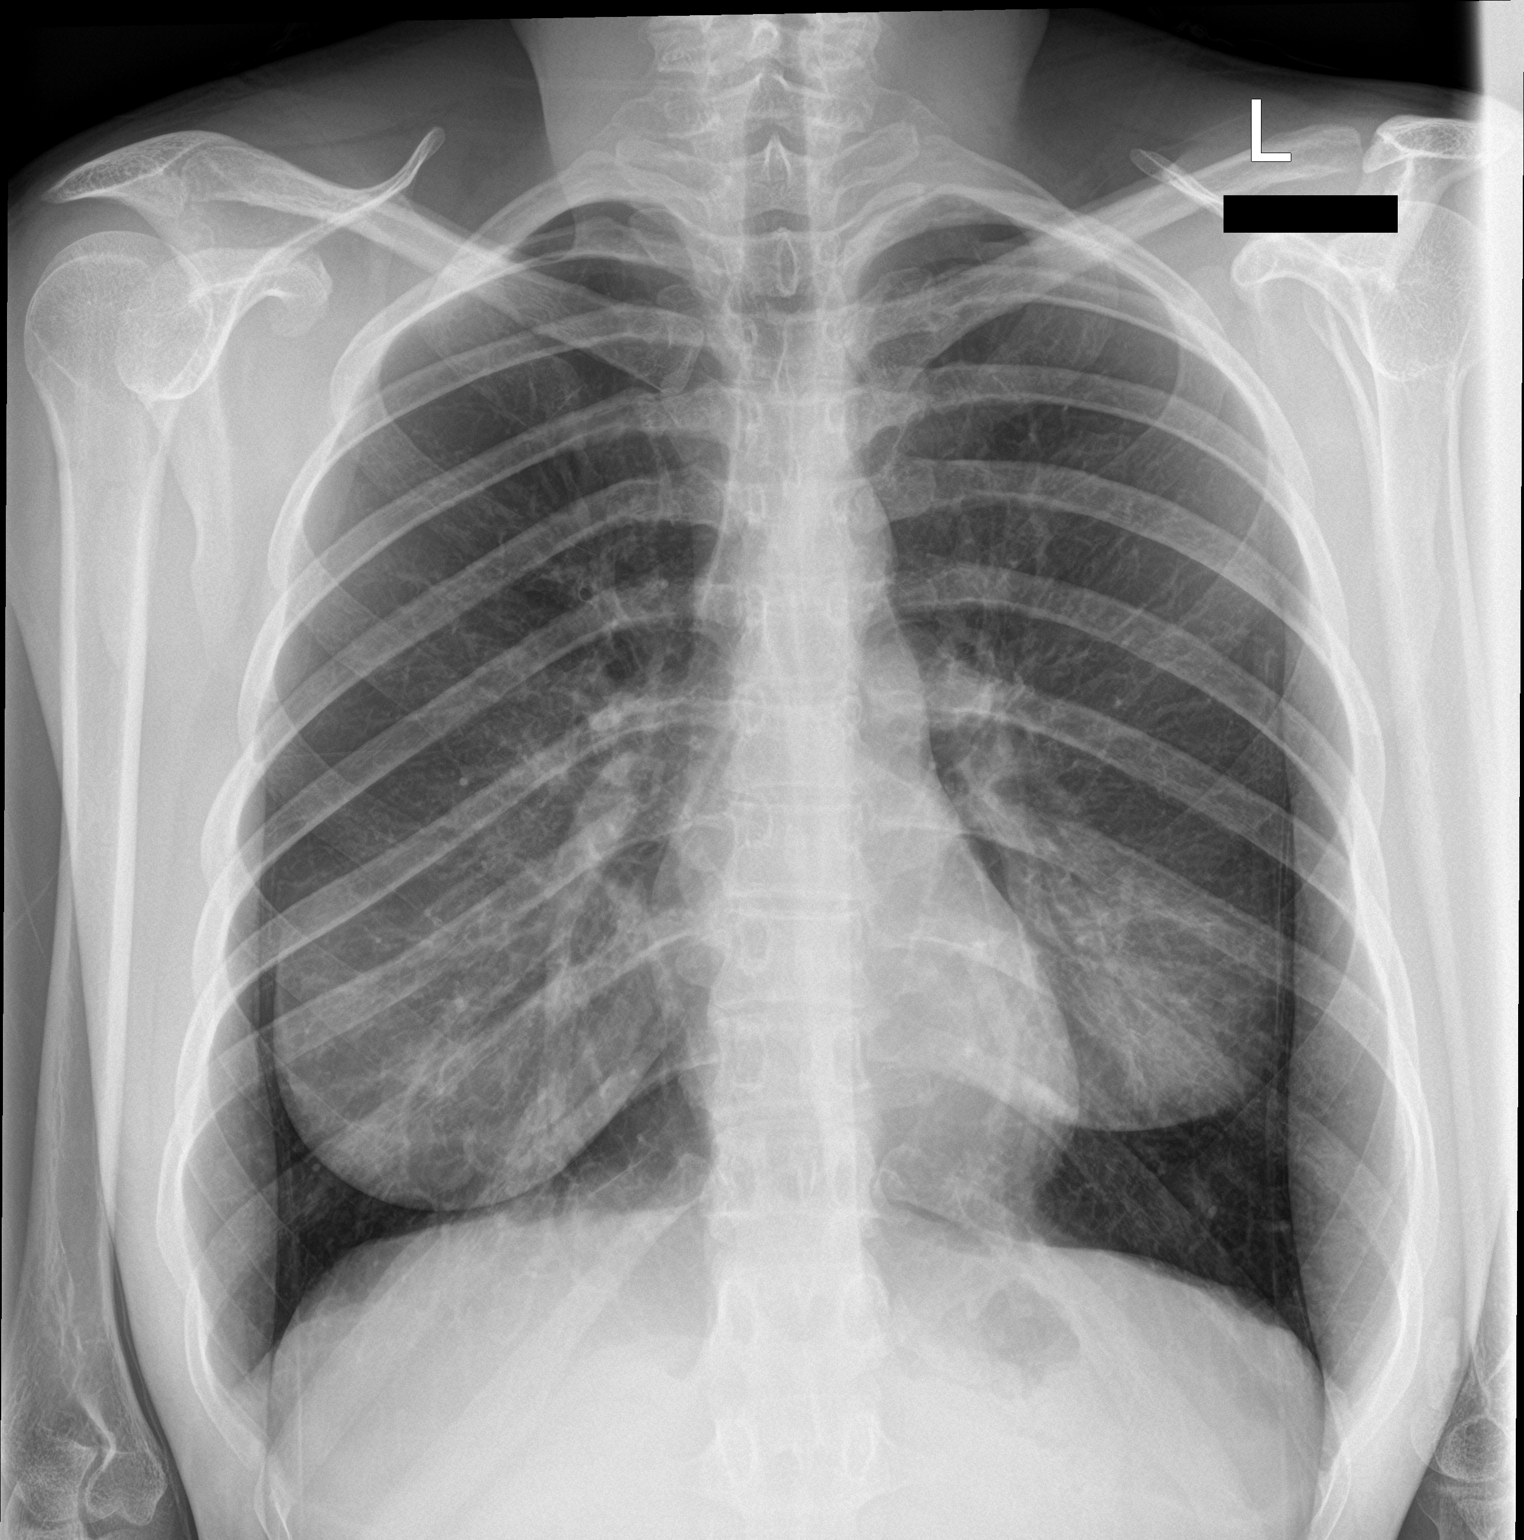

[chest lat]
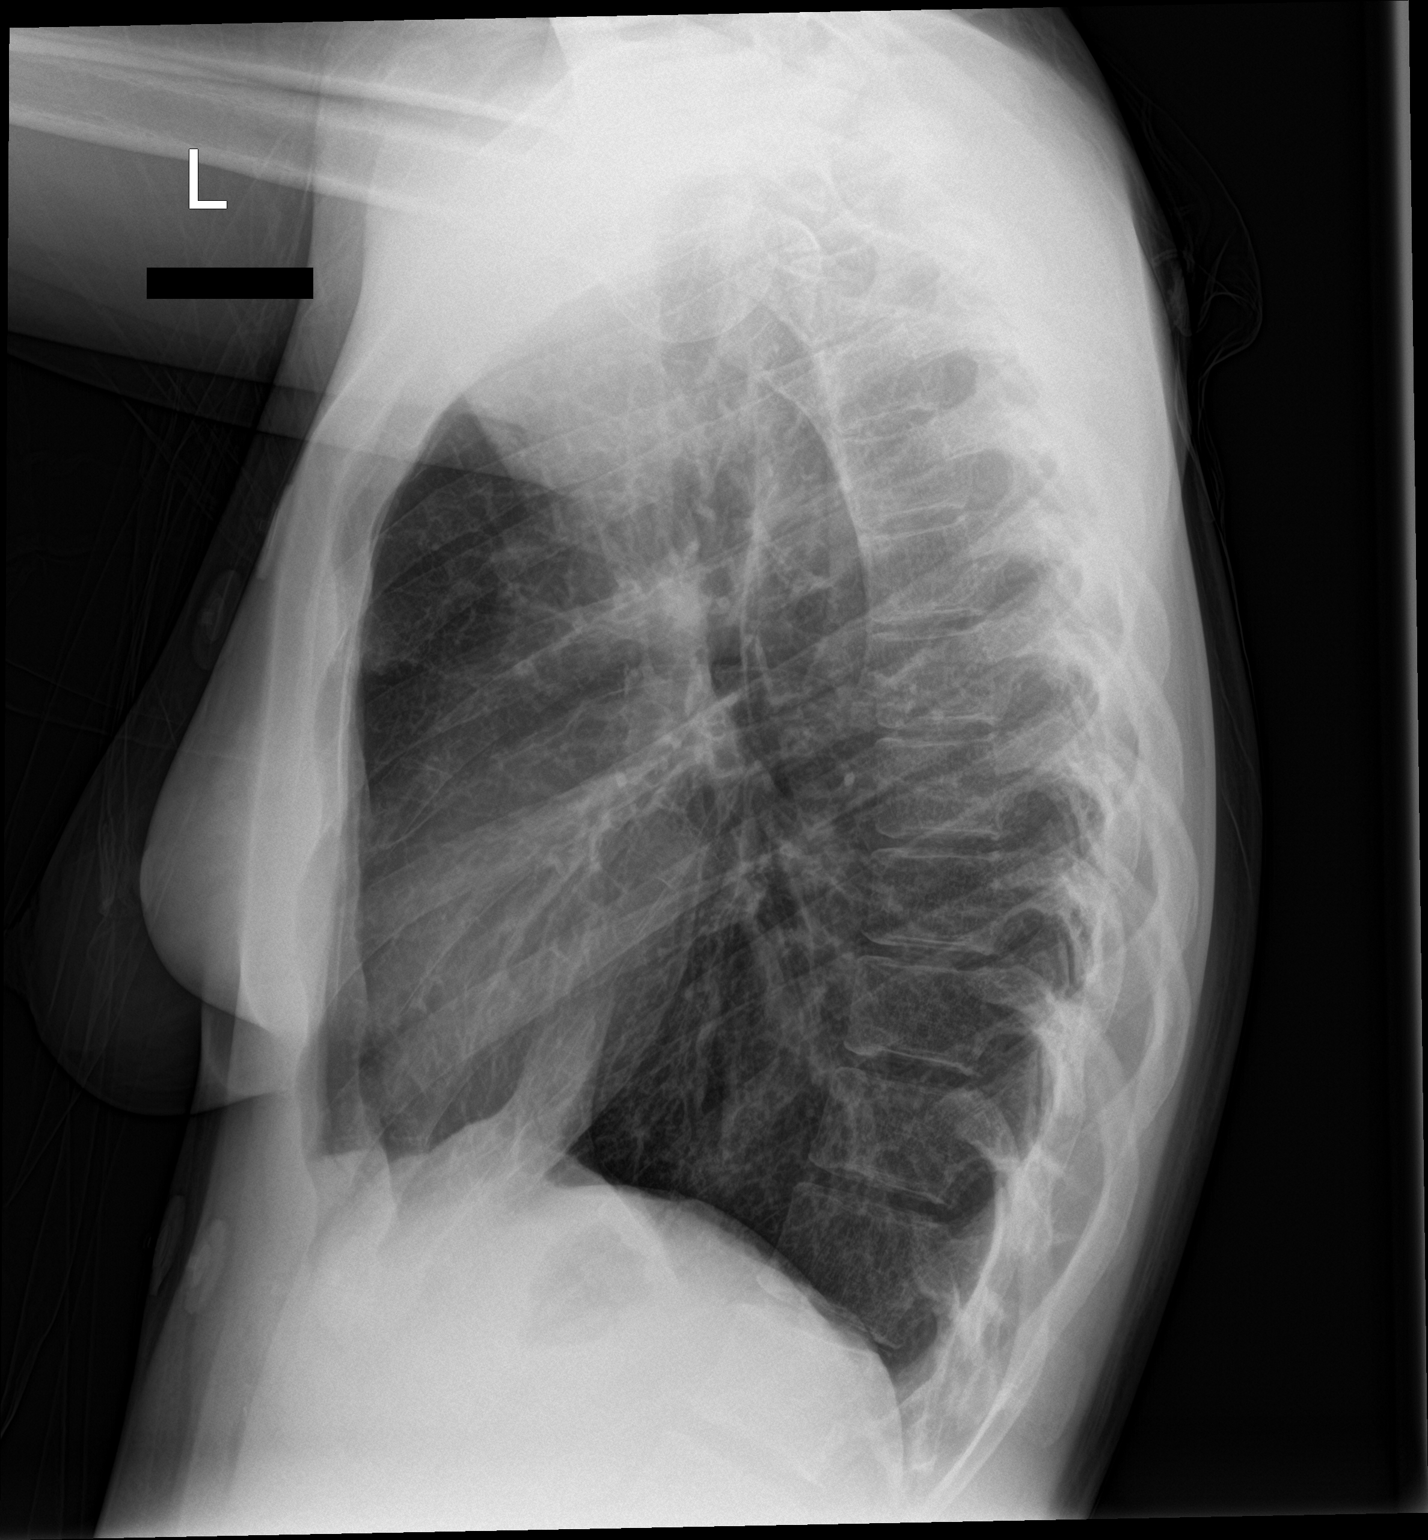

[2 of 2 positions shown; findings below may reference images not displayed]

FINDINGS: Heart and mediastinal contours are within normal limits. No focal
opacities or effusions. No acute bony abnormality.
IMPRESSION: Negative.
